# Patient Record
Sex: Male | Born: 1990 | Hispanic: No
Health system: Southern US, Community
[De-identification: ages and names within clinical notes are randomized; demographics above are authoritative.]

## PROBLEM LIST (undated history)

## (undated) ENCOUNTER — Emergency Department (HOSPITAL_BASED_OUTPATIENT_CLINIC_OR_DEPARTMENT_OTHER): Admission: EM | Payer: Self-pay | Source: Home / Self Care

---
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
## 2009-12-02 ENCOUNTER — Ambulatory Visit: Payer: Self-pay | Admitting: Diagnostic Radiology

## 2009-12-02 ENCOUNTER — Emergency Department (HOSPITAL_BASED_OUTPATIENT_CLINIC_OR_DEPARTMENT_OTHER): Admission: EM | Admit: 2009-12-02 | Discharge: 2009-12-03 | Payer: Self-pay | Admitting: Emergency Medicine

## 2018-04-20 ENCOUNTER — Emergency Department (HOSPITAL_BASED_OUTPATIENT_CLINIC_OR_DEPARTMENT_OTHER)
Admission: EM | Admit: 2018-04-20 | Discharge: 2018-04-20 | Disposition: A | Discharge status: Home / Self Care | Payer: 59 | Attending: Emergency Medicine | Admitting: Emergency Medicine

## 2018-04-20 ENCOUNTER — Emergency Department (HOSPITAL_BASED_OUTPATIENT_CLINIC_OR_DEPARTMENT_OTHER): Payer: 59

## 2018-04-20 ENCOUNTER — Encounter (HOSPITAL_BASED_OUTPATIENT_CLINIC_OR_DEPARTMENT_OTHER): Payer: Self-pay | Admitting: Emergency Medicine

## 2018-04-20 ENCOUNTER — Other Ambulatory Visit: Payer: Self-pay

## 2018-04-20 DIAGNOSIS — N200 Calculus of kidney: Secondary | ICD-10-CM

## 2018-04-20 DIAGNOSIS — R1032 Left lower quadrant pain: Secondary | ICD-10-CM | POA: Diagnosis present

## 2018-04-20 LAB — BASIC METABOLIC PANEL
Anion gap: 11 (ref 5–15)
BUN: 15 mg/dL (ref 6–20)
CO2: 24 mmol/L (ref 22–32)
Calcium: 9.2 mg/dL (ref 8.9–10.3)
Chloride: 99 mmol/L (ref 98–111)
Creatinine, Ser: 1.3 mg/dL — ABNORMAL HIGH (ref 0.61–1.24)
GFR calc Af Amer: >60 mL/min (ref >60)
GFR calc non Af Amer: >60 mL/min (ref >60)
Glucose, Bld: 106 mg/dL — ABNORMAL HIGH (ref 70–99)
Potassium: 4 mmol/L (ref 3.5–5.1)
Sodium: 134 mmol/L — ABNORMAL LOW (ref 135–145)

## 2018-04-20 LAB — CBC WITH DIFFERENTIAL/PLATELET
Basophils Absolute: 0 10*3/uL (ref 0.0–0.1)
Basophils Relative: 0 %
Eosinophils Absolute: 0 10*3/uL (ref 0.0–0.7)
Eosinophils Relative: 0 %
HCT: 42 % (ref 39.0–52.0)
Hemoglobin: 16 g/dL (ref 13.0–17.0)
Lymphocytes Relative: 6 %
Lymphs Abs: 0.7 10*3/uL (ref 0.7–4.0)
MCH: 32.1 pg (ref 26.0–34.0)
MCHC: 37 g/dL — ABNORMAL HIGH (ref 30.0–36.0)
MCV: 86.1 fL (ref 78.0–100.0)
Monocytes Absolute: 0.7 10*3/uL (ref 0.1–1.0)
Monocytes Relative: 6 %
Neutro Abs: 9.8 10*3/uL — ABNORMAL HIGH (ref 1.7–7.7)
Neutrophils Relative %: 88 %
Platelets: 181 10*3/uL (ref 150–400)
RBC: 4.88 MIL/uL (ref 4.22–5.81)
RDW: 12.5 % (ref 11.5–15.5)
WBC: 11.2 10*3/uL — ABNORMAL HIGH (ref 4.0–10.5)

## 2018-04-20 LAB — URINALYSIS, MICROSCOPIC (REFLEX)

## 2018-04-20 LAB — URINALYSIS, ROUTINE W REFLEX MICROSCOPIC
Bilirubin Urine: NEGATIVE
Glucose, UA: NEGATIVE mg/dL
Ketones, ur: 40 mg/dL — AB
Leukocytes, UA: NEGATIVE
Nitrite: NEGATIVE
Protein, ur: NEGATIVE mg/dL
Specific Gravity, Urine: 1.02 (ref 1.005–1.030)
pH: 7 (ref 5.0–8.0)

## 2018-04-20 MED ORDER — ONDANSETRON HCL 4 MG/2ML IJ SOLN
4.0000 mg | Freq: Once | INTRAMUSCULAR | Status: AC
  Administered 2018-04-20: 4 mg via INTRAVENOUS
  Filled 2018-04-20: qty 2

## 2018-04-20 MED ORDER — KETOROLAC TROMETHAMINE 30 MG/ML IJ SOLN
30.0000 mg | Freq: Once | INTRAMUSCULAR | Status: AC
  Administered 2018-04-20: 30 mg via INTRAVENOUS
  Filled 2018-04-20: qty 1

## 2018-04-20 MED ORDER — HYDROMORPHONE HCL 1 MG/ML IJ SOLN
1.0000 mg | Freq: Once | INTRAMUSCULAR | Status: AC
  Administered 2018-04-20: 1 mg via INTRAVENOUS
  Filled 2018-04-20: qty 1

## 2018-04-20 MED ORDER — ONDANSETRON 4 MG PO TBDP: 4.0000 mg | ORAL_TABLET | Freq: Three times a day (TID) | ORAL | 0 refills | Status: AC | PRN

## 2018-04-20 MED ORDER — HYDROCODONE-ACETAMINOPHEN 5-325 MG PO TABS: 1.0000 | ORAL_TABLET | Freq: Four times a day (QID) | ORAL | 0 refills | Status: AC | PRN

## 2018-04-20 NOTE — Discharge Instructions (Signed)
You can take Tylenol or Ibuprofen as directed for pain. You can alternate Tylenol and Ibuprofen every 4 hours. If you take Tylenol at 1pm, then you can take Ibuprofen at 5pm. Then you can take Tylenol again at 9pm.   Take the pain medication for fear of breakthrough pain.  Take Zofran for nausea.  As we discussed, strain your urine.  Follow-up with urology as directed.  Return to emergency department for any worsening abdominal pain, fevers, nausea/vomiting or any other worsening or concerning symptoms.

## 2018-04-20 NOTE — ED Triage Notes (Signed)
Reports intermittent left flank pain since Monday, worse today.  Reports dysuria, hematuria, nausea, vomiting.

## 2018-04-20 NOTE — ED Provider Notes (Signed)
[facility] EMERGENCY DEPARTMENT Provider Note   CSN: [id number] Arrival date & time: 04/20/18  1637     History   Chief Complaint Chief Complaint  Patient presents with  . Flank Pain    HPI Colin White. is a 27 y.o. male with no significant past medical history presents for evaluation of left flank pain that got worse today.  Patient reports that over last week, he has had intermittent flank pain.  He was seen by his primary care doctor approximately 1 week ago noted some small hemoglobin in his urine.  Patient was given Flomax until he was most likely have a small stone.  He reports he has had some intermittent pain since then.  He reports that this morning, became acutely worse.  He states that his left flank and radiates to the left abdomen.  He also reports some nausea, dysuria, hematuria.  Patient states that he has not had any fever.  Patient states that he has not taken any medication for the pain.  Patient denies any chest pain, difficulty breathing, testicular pain, testicular swelling, penile pain.  The history is provided by the patient.    History reviewed. No pertinent past medical history.  There are no active problems to display for this patient.    The histories are not reviewed yet. Please review them in the "History" navigator section and refresh this SmartLink.      Home Medications    Prior to Admission medications   Medication Sig Start Date End Date Taking? Authorizing Provider  HYDROcodone-acetaminophen (NORCO/VICODIN) 5-325 MG tablet Take 1-2 tablets by mouth every 6 (six) hours as needed. 04/20/18   Maxwell Caul, PA-C  ondansetron (ZOFRAN ODT) 4 MG disintegrating tablet Take 1 tablet (4 mg total) by mouth every 8 (eight) hours as needed for nausea or vomiting. 04/20/18   Maxwell Caul, PA-C    Family History History reviewed. No pertinent family history.  Social History Social History   Tobacco Use  . Smoking status: Not  on file  Substance Use Topics  . Alcohol use: Not on file  . Drug use: Not on file     Allergies   Patient has no known allergies.   Review of Systems Review of Systems  Constitutional: Negative for fever.  Respiratory: Negative for cough and shortness of breath.   Cardiovascular: Negative for chest pain.  Gastrointestinal: Positive for nausea. Negative for abdominal pain and vomiting.  Genitourinary: Positive for dysuria, flank pain and hematuria.  Neurological: Negative for headaches.  All other systems reviewed and are negative.    Physical Exam Updated Vital Signs BP 123/73 (BP Location: Right Arm)   Pulse 83   Temp 98.2 F (36.8 C) (Oral)   Resp 15   Ht 6\' 2"  (1.88 m)   Wt 95.3 kg   SpO2 98%   BMI 26.96 kg/m   Physical Exam  Constitutional: He is oriented to person, place, and time. He appears well-developed and well-nourished.  HENT:  Head: Normocephalic and atraumatic.  Mouth/Throat: Oropharynx is clear and moist and mucous membranes are normal.  Eyes: Pupils are equal, round, and reactive to light. Conjunctivae, EOM and lids are normal.  Neck: Full passive range of motion without pain.  Cardiovascular: Normal rate, regular rhythm, normal heart sounds and normal pulses. Exam reveals no gallop and no friction rub.  No murmur heard. Pulmonary/Chest: Effort normal and breath sounds normal.  Abdominal: Soft. Normal appearance and bowel sounds are normal. There  is tenderness in the left lower quadrant. There is CVA tenderness (Left side). There is no rigidity and no guarding. Hernia confirmed negative in the right inguinal area and confirmed negative in the left inguinal area.  Genitourinary: Testes normal and penis normal. Right testis shows no swelling and no tenderness. Left testis shows no swelling and no tenderness. Circumcised.  Genitourinary Comments: The exam was performed with a chaperone present. Normal male genitalia. No evidence of rash, ulcers or  lesions.   Musculoskeletal: Normal range of motion.  Neurological: He is alert and oriented to person, place, and time.  Skin: Skin is warm and dry. Capillary refill takes less than 2 seconds.  Psychiatric: He has a normal mood and affect. His speech is normal.  Nursing note and vitals reviewed.    ED Treatments / Results  Labs (all labs ordered are listed, but only abnormal results are displayed) Labs Reviewed  URINALYSIS, ROUTINE W REFLEX MICROSCOPIC - Abnormal; Notable for the following components:      Result Value   Hgb urine dipstick LARGE (*)    Ketones, ur 40 (*)    All other components within normal limits  URINALYSIS, MICROSCOPIC (REFLEX) - Abnormal; Notable for the following components:   Bacteria, UA RARE (*)    All other components within normal limits  BASIC METABOLIC PANEL - Abnormal; Notable for the following components:   Sodium 134 (*)    Glucose, Bld 106 (*)    Creatinine, Ser 1.30 (*)    All other components within normal limits  CBC WITH DIFFERENTIAL/PLATELET - Abnormal; Notable for the following components:   WBC 11.2 (*)    MCHC 37.0 (*)    Neutro Abs 9.8 (*)    All other components within normal limits    EKG None  Radiology Ct Renal Stone Study  Result Date: 04/20/2018 CLINICAL DATA:  Left flank pain 4 days with hematuria. EXAM: CT ABDOMEN AND PELVIS WITHOUT CONTRAST TECHNIQUE: Multidetector CT imaging of the abdomen and pelvis was performed following the standard protocol without IV contrast. COMPARISON:  None. FINDINGS: Lower chest: Normal. Hepatobiliary: Gallbladder, liver and biliary tree are normal. Pancreas: Normal. Spleen: Normal. Adrenals/Urinary Tract: Adrenal glands are normal. Kidneys are normal in size. There is a 3 mm calcification over the lower pole left kidney. There is mild left-sided hydronephrosis with mild stranding/fluid in the left perinephric space. Mild prominence of the left ureter as there is a 5-6 mm stone at the left UVJ. No  right-sided renal or ureteral stones. Bladder is normal. Stomach/Bowel: Stomach, small bowel and appendix are normal. Colon is normal. Vascular/Lymphatic: Normal. Reproductive: Normal. Other: None. Musculoskeletal: Unremarkable. IMPRESSION: Left-sided nephrolithiasis. 5-6 mm stone at the left UVJ causing low-grade obstruction. Electronically Signed   By: Elberta Fortis M.D.   On: 04/20/2018 18:50    Procedures Procedures (including critical care time)  Medications Ordered in ED Medications  ondansetron (ZOFRAN) injection 4 mg (4 mg Intravenous Given 04/20/18 1858)  ketorolac (TORADOL) 30 MG/ML injection 30 mg (30 mg Intravenous Given 04/20/18 1859)  HYDROmorphone (DILAUDID) injection 1 mg (1 mg Intravenous Given 04/20/18 1957)     Initial Impression / Assessment and Plan / ED Course  I have reviewed the triage vital signs and the nursing notes.  Pertinent labs & imaging results that were available during my care of the patient were reviewed by me and considered in my medical decision making (see chart for details).     27 year old male who presents for evaluation of intermittent  left flank pain that began a week ago.  Worsened today.  Associated with nausea, dysuria, hematuria.  Seen at PCP week ago and was given Flomax. Patient is afebrile, non-toxic appearing, sitting comfortably on examination table. Vital signs reviewed and stable.  On exam, tenderness palpation left lower quadrant, left-sided CVA tenderness noted.  UA ordered at triage.  Consider kidney stone versus UTI versus acute infectious etiology.  UA shows large hemoglobin.  Will check BMP, CBC.  Will get renal study for evaluation of kidney stone.  CBC shows leukocytosis of 11.2.  Otherwise unremarkable.  BMP shows creatinine at 1.30.  CT scan shows a 5 to 6 mm left UVJ stone with some left hydronephrosis.  Discussed results with patient.  Reports improvement in pain after Toradol.  Still having some mild pain.  No evidence of  nausea/vomiting here in the ED.  Will give repeat analgesics.  Reevaluation after second pain medication.  Patient reports improvement in pain.  Vital signs are stable.  Patient tolerating p.o. in the department any difficulty.  Patient stable for discharge at this time.  We will plan to give pain medication, antiemetics.  He Artie has Flomax.  Encourage continued use.  Will give outpatient urology follow-up. Patient had ample opportunity for questions and discussion. All patient's questions were answered with full understanding. Strict return precautions discussed. Patient expresses understanding and agreement to plan.   Final Clinical Impressions(s) / ED Diagnoses   Final diagnoses:  Kidney stone    ED Discharge Orders         Ordered    HYDROcodone-acetaminophen (NORCO/VICODIN) 5-325 MG tablet  Every 6 hours PRN     04/20/18 2007    ondansetron (ZOFRAN ODT) 4 MG disintegrating tablet  Every 8 hours PRN     04/20/18 2007           Rosana HoesLayden, Colman Birdwell A, PA-C 04/20/18 2018    Melene PlanFloyd, Dan, DO 04/20/18 2315

## 2019-10-19 IMAGING — CT CT RENAL STONE PROTOCOL
2 of 4 series · 17 of 46 positions shown, 19 images · non-contrast
Comparison: None.

CLINICAL DATA: Left flank pain 4 days with hematuria.

EXAM:
CT ABDOMEN AND PELVIS WITHOUT CONTRAST
TECHNIQUE: Multidetector CT imaging of the abdomen and pelvis was performed
following the standard protocol without IV contrast.

[Series 2: axial st · axial · 0.98mm/px · z∈[-720,-255]mm · 14 of 103 slices shown, 16 images]
[im 5/103  soft-tissue]
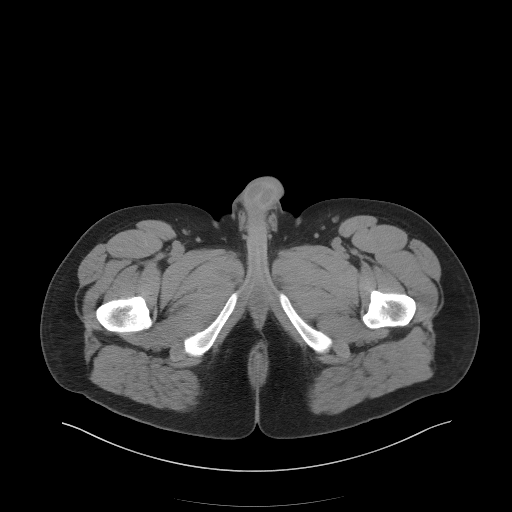
[im 5/103  bone]
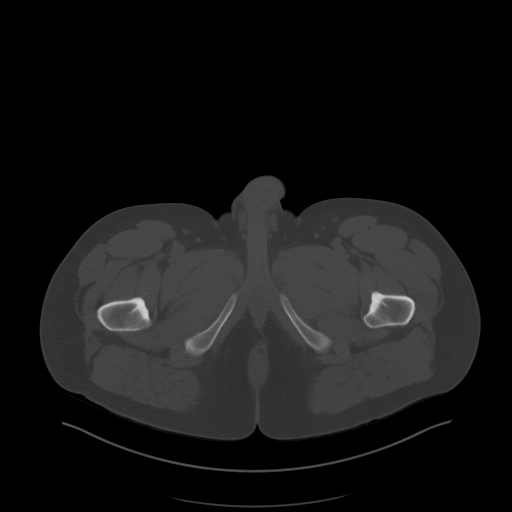
[im 13/103  soft-tissue]
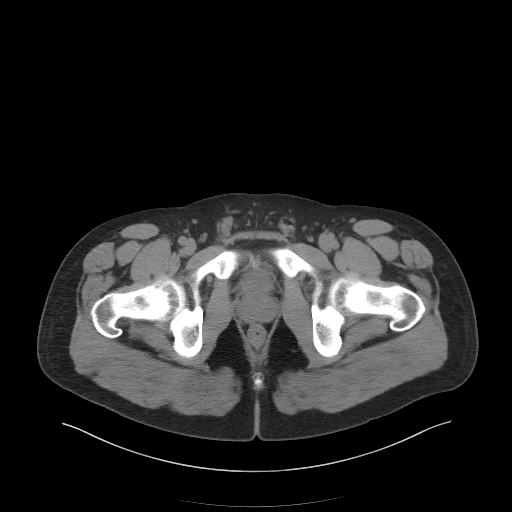
[im 22/103  soft-tissue]
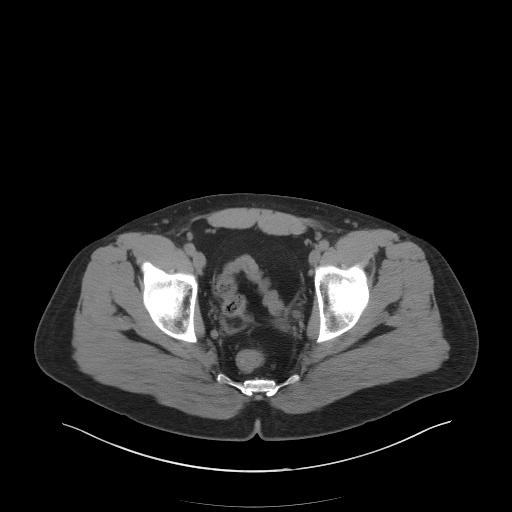
[im 26/103  soft-tissue]
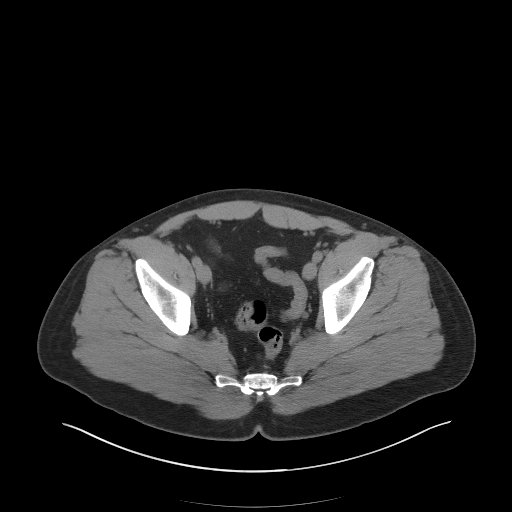
[im 35/103  soft-tissue]
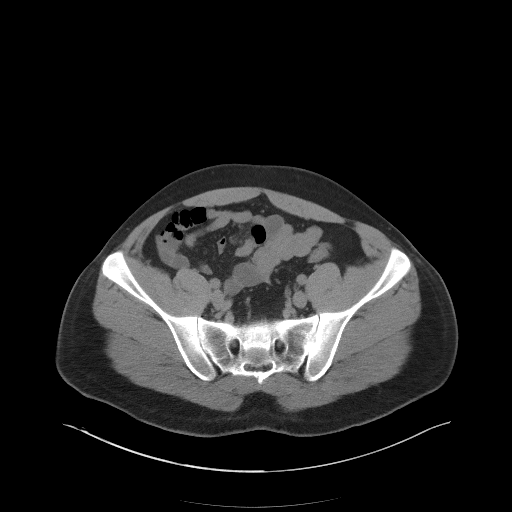
[im 43/103  soft-tissue]
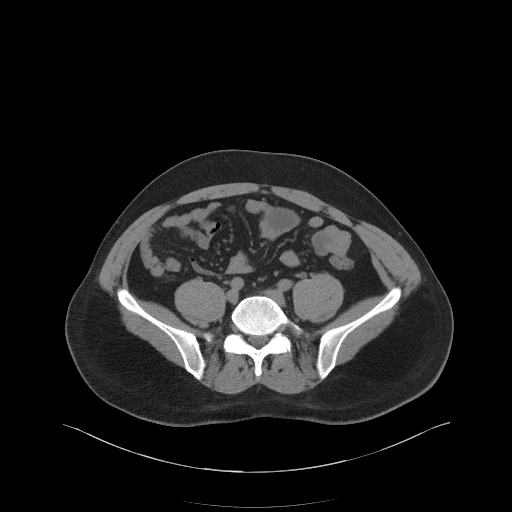
[im 47/103  soft-tissue]
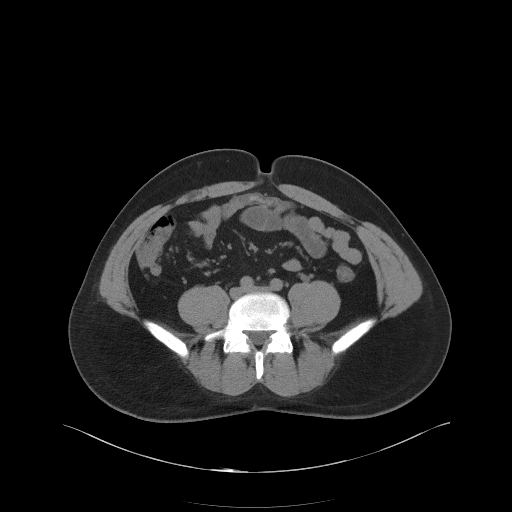
[im 56/103  soft-tissue]
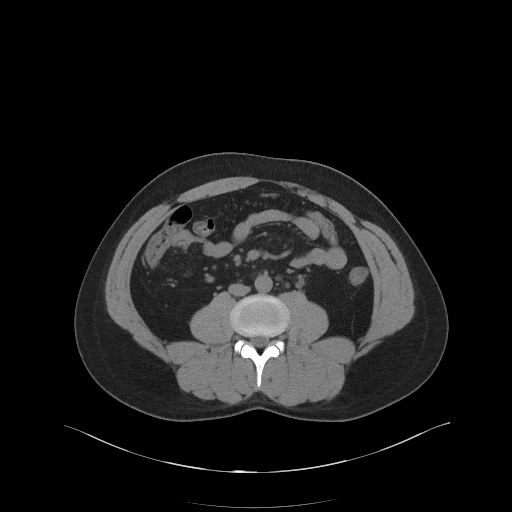
[im 60/103  soft-tissue]
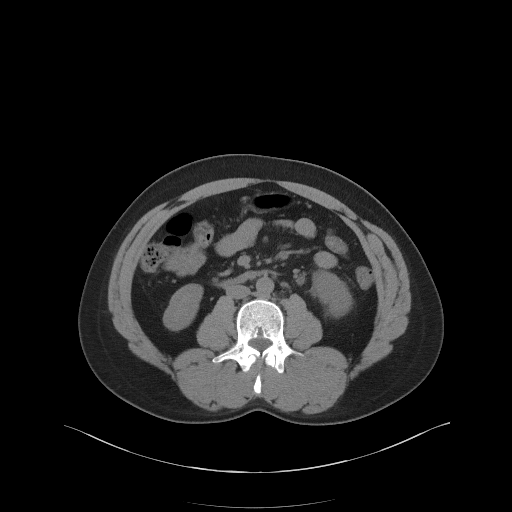
[im 60/103  bone]
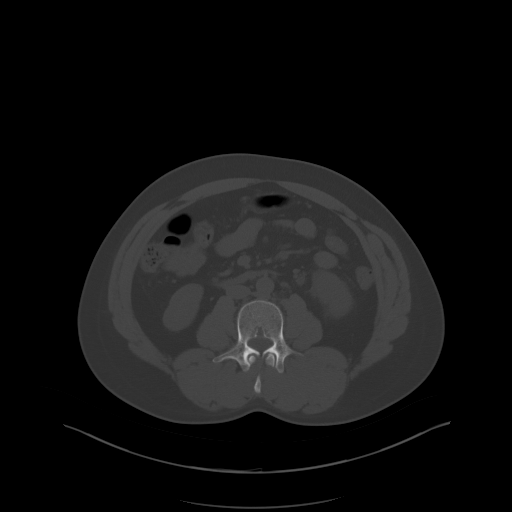
[im 69/103  soft-tissue]
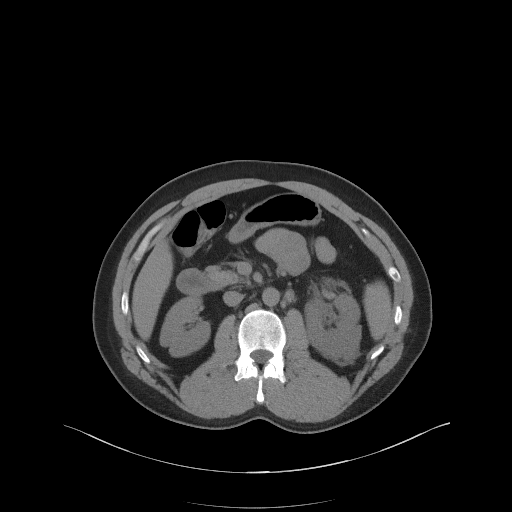
[im 77/103  soft-tissue]
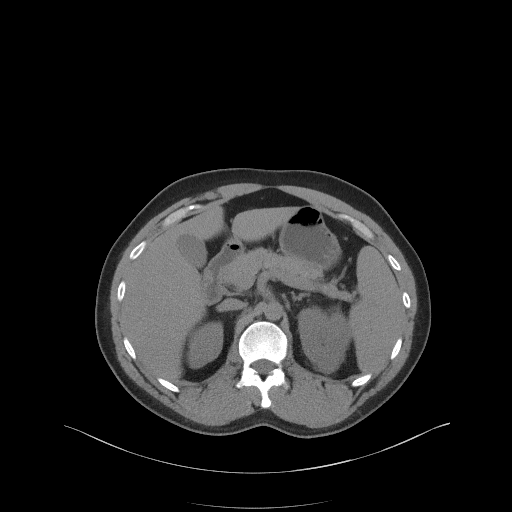
[im 81/103  soft-tissue]
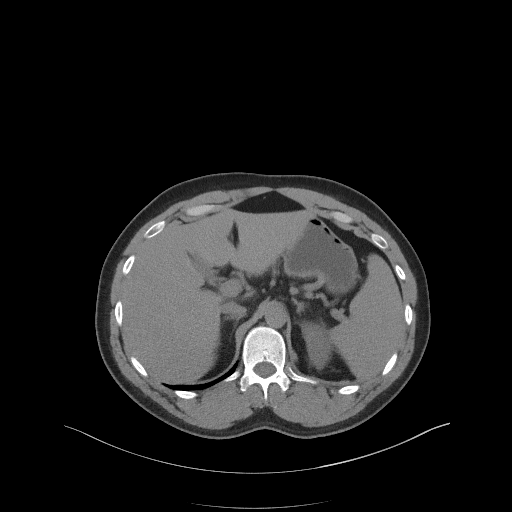
[im 90/103  soft-tissue]
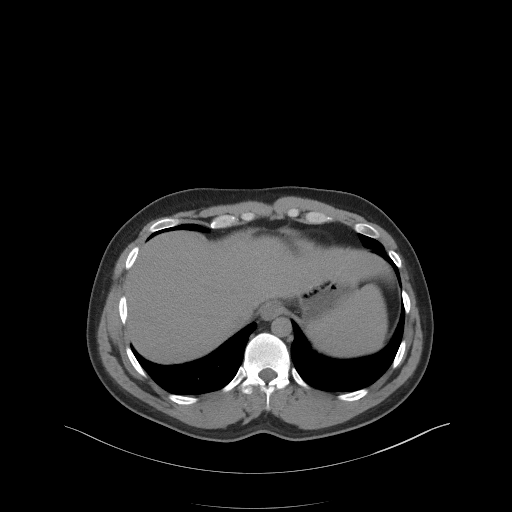
[im 98/103  soft-tissue]
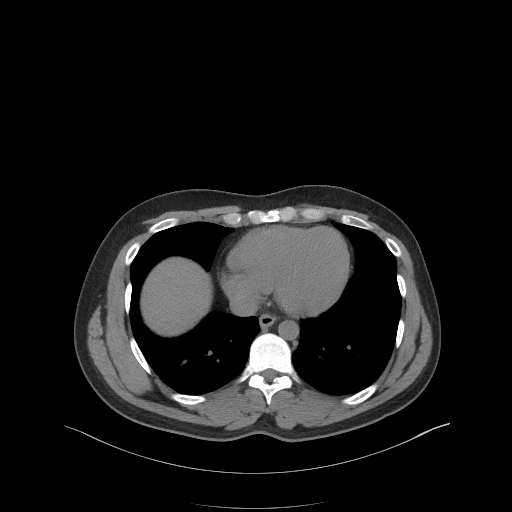

[Series 5: coronal st · coronal · 0.97mm/px · 3 of 83 slices shown]
[im 28/83  soft-tissue]
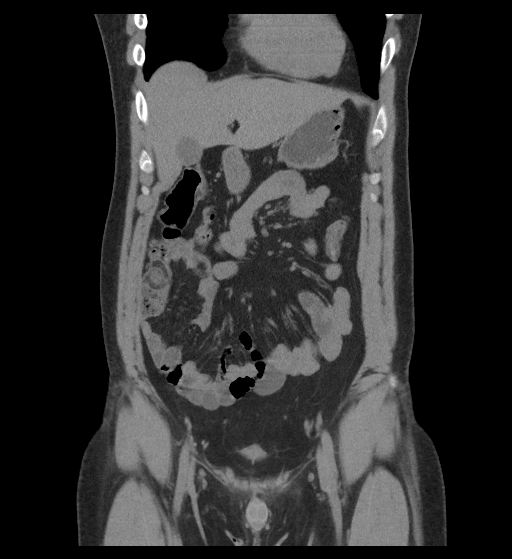
[im 37/83  soft-tissue]
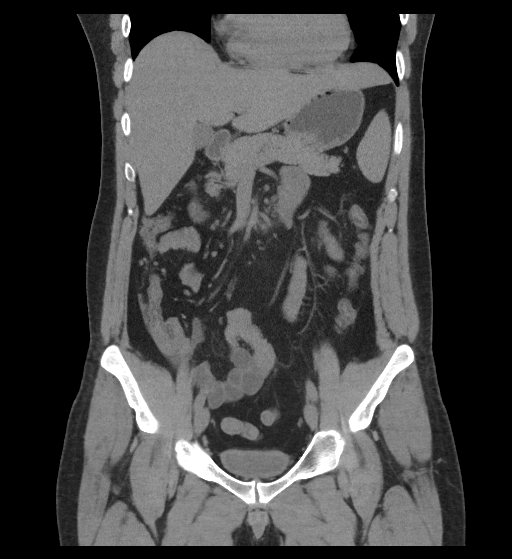
[im 46/83  soft-tissue]
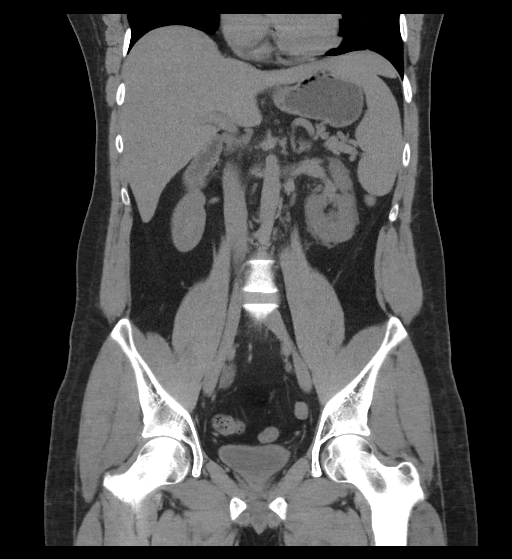

[17 of 46 positions shown; findings below may reference images not displayed]

FINDINGS: Lower chest: Normal.

Hepatobiliary: Gallbladder, liver and biliary tree are normal.

Pancreas: Normal.

Spleen: Normal.

Adrenals/Urinary Tract: Adrenal glands are normal. Kidneys are
normal in size. There is a 3 mm calcification over the lower pole
left kidney. There is mild left-sided hydronephrosis with mild
stranding/fluid in the left perinephric space. Mild prominence of
the left ureter as there is a 5-6 mm stone at the left UVJ. No
right-sided renal or ureteral stones. Bladder is normal.

Stomach/Bowel: Stomach, small bowel and appendix are normal. Colon
is normal.

Vascular/Lymphatic: Normal.

Reproductive: Normal.

Other: None.

Musculoskeletal: Unremarkable.
IMPRESSION: Left-sided nephrolithiasis. 5-6 mm stone at the left UVJ causing
low-grade obstruction.

## 2019-11-05 ENCOUNTER — Ambulatory Visit: Payer: 59
